# Patient Record
Sex: Male | Born: 1990 | Hispanic: No | Marital: Single | Smoking: Current every day smoker
Health system: Southern US, Community
[De-identification: ages and names within clinical notes are randomized; demographics above are authoritative.]

## PROBLEM LIST (undated history)

## (undated) DIAGNOSIS — Z789 Other specified health status: Secondary | ICD-10-CM

---
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
## 2012-01-11 ENCOUNTER — Emergency Department (HOSPITAL_COMMUNITY)
Admission: EM | Admit: 2012-01-11 | Discharge: 2012-01-11 | Disposition: A | Discharge status: Home / Self Care | Payer: Medicaid Other | Attending: Emergency Medicine | Admitting: Emergency Medicine

## 2012-01-11 ENCOUNTER — Emergency Department (HOSPITAL_COMMUNITY): Payer: Medicaid Other

## 2012-01-11 DIAGNOSIS — Z79899 Other long term (current) drug therapy: Secondary | ICD-10-CM | POA: Insufficient documentation

## 2012-01-11 DIAGNOSIS — K5900 Constipation, unspecified: Secondary | ICD-10-CM

## 2012-01-11 LAB — COMPREHENSIVE METABOLIC PANEL
AST: 24 U/L (ref 0–37)
Albumin: 4.1 g/dL (ref 3.5–5.2)
BUN: 10 mg/dL (ref 6–23)
Calcium: 9.6 mg/dL (ref 8.4–10.5)
Creatinine, Ser: 0.96 mg/dL (ref 0.50–1.35)
GFR calc non Af Amer: >90 mL/min (ref >90)

## 2012-01-11 LAB — LIPASE, BLOOD: Lipase: 16 U/L (ref 11–59)

## 2012-01-11 LAB — CBC
MCH: 24.6 pg — ABNORMAL LOW (ref 26.0–34.0)
MCV: 73.7 fL — ABNORMAL LOW (ref 78.0–100.0)
Platelets: ADEQUATE 10*3/uL (ref 150–400)
RDW: 16.6 % — ABNORMAL HIGH (ref 11.5–15.5)

## 2012-01-11 LAB — OCCULT BLOOD, POC DEVICE: Fecal Occult Bld: NEGATIVE

## 2012-01-11 MED ORDER — FLEET ENEMA 7-19 GM/118ML RE ENEM
1.0000 | ENEMA | Freq: Once | RECTAL | Status: AC
  Administered 2012-01-11: 1 via RECTAL
  Filled 2012-01-11: qty 1

## 2012-01-11 MED ORDER — SENNA-DOCUSATE SODIUM 8.6-50 MG PO TABS: 1.0000 | ORAL_TABLET | Freq: Every day | ORAL | Status: DC

## 2012-01-11 MED ORDER — POLYETHYLENE GLYCOL 3350 17 G PO PACK
17.0000 g | PACK | Freq: Every day | ORAL | Status: DC
  Administered 2012-01-11: 17 g via ORAL
  Filled 2012-01-11 (×2): qty 1

## 2012-01-11 MED ORDER — SODIUM CHLORIDE 0.9 % IV BOLUS (SEPSIS)
1000.0000 mL | Freq: Once | INTRAVENOUS | Status: AC
  Administered 2012-01-11: 1000 mL via INTRAVENOUS

## 2012-01-11 MED ORDER — MINERAL OIL RE ENEM
1.0000 | ENEMA | Freq: Once | RECTAL | Status: AC
  Administered 2012-01-11: 1 via RECTAL
  Filled 2012-01-11: qty 1

## 2012-01-11 MED ORDER — IBUPROFEN 600 MG PO TABS: 600.0000 mg | ORAL_TABLET | Freq: Four times a day (QID) | ORAL | Status: DC | PRN

## 2012-01-11 MED ORDER — POLYETHYLENE GLYCOL 3350 17 G PO PACK: 17.0000 g | PACK | Freq: Every day | ORAL | Status: DC
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
## 2012-01-11 NOTE — ED Notes (Signed)
Enema administered. Pt able to hold less than 5 min. amb to BR without problem
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
## 2012-01-11 NOTE — ED Notes (Signed)
Enema set ordered from [employer] [facility]

## 2012-01-11 NOTE — ED Notes (Signed)
Mineral oil enema given with 500ccs of warm H2O per MD order.

## 2012-01-11 NOTE — ED Notes (Signed)
Pt reports no results with enema. Drank mirilax without problem

## 2012-01-11 NOTE — ED Notes (Signed)
Patient transported to X-ray 

## 2012-01-11 NOTE — ED Notes (Signed)
Pt able to hold enema about 8 minutes this time.  On BSC at present time

## 2012-01-11 NOTE — ED Notes (Signed)
Pt had BM into BSC small amt of brown stool noted with fluid
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
## 2012-01-11 NOTE — ED Notes (Signed)
Pharmacy notified to send medicines

## 2012-01-11 NOTE — ED Notes (Signed)
Pt states he feels much better. Mother to bedside.  States he is ready to go home

## 2013-08-30 ENCOUNTER — Ambulatory Visit (INDEPENDENT_AMBULATORY_CARE_PROVIDER_SITE_OTHER): Payer: No Typology Code available for payment source | Admitting: Internal Medicine

## 2013-08-30 ENCOUNTER — Encounter: Payer: Self-pay | Admitting: Internal Medicine

## 2013-08-30 VITALS — BP 119/43 | HR 57 | Temp 96.7°F | Ht 70.0 in | Wt 143.4 lb

## 2013-08-30 DIAGNOSIS — F17200 Nicotine dependence, unspecified, uncomplicated: Secondary | ICD-10-CM

## 2013-08-30 DIAGNOSIS — W3400XA Accidental discharge from unspecified firearms or gun, initial encounter: Secondary | ICD-10-CM

## 2013-08-30 DIAGNOSIS — Z720 Tobacco use: Secondary | ICD-10-CM

## 2013-08-30 DIAGNOSIS — Z09 Encounter for follow-up examination after completed treatment for conditions other than malignant neoplasm: Secondary | ICD-10-CM

## 2013-08-30 DIAGNOSIS — F1010 Alcohol abuse, uncomplicated: Secondary | ICD-10-CM

## 2013-08-30 DIAGNOSIS — F4310 Post-traumatic stress disorder, unspecified: Secondary | ICD-10-CM

## 2013-08-30 DIAGNOSIS — S31139A Puncture wound of abdominal wall without foreign body, unspecified quadrant without penetration into peritoneal cavity, initial encounter: Secondary | ICD-10-CM

## 2013-08-30 MED ORDER — CITALOPRAM HYDROBROMIDE 20 MG PO TABS: 20.0000 mg | ORAL_TABLET | Freq: Every day | ORAL | Status: AC

## 2013-08-30 NOTE — Assessment & Plan Note (Signed)
PTSD Checklist for civilian version (PCL-C) was done in the office in my presence and patient scored 52 out of 85. According to up-to-date, a score greater than or equal to 50 suggests the possibility of PTSD (sensitivity 69-94 and specificity 83-99) and upon further interviewing the patient, I believe that his symptoms are severe enough interfering with his regular day to day life. Discussed with the attending regarding further management and plan. Discussed with the patient regarding treatment options such as psychotherapy vs medications vs both.  Plans: Patient prefered medication therapy for his symptoms suggestive of PTSD. Start SSRI therapy with Citalopram 20 mg. The possible adverse outcomes were explained to the patient and recommended to give me a call if he encounters any of the adverse outcomes. Printed material given to the patient about PTSD and Citalopram. Consider psychotherapy as an additional treatment in the subsequent office visits. The scored PCL-C PTSD check list handed over to my nurse to be scanned to the EPIC. Follow up in a month. Consider up-titration of the Citalopram as needed.
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
## 2013-08-30 NOTE — Assessment & Plan Note (Signed)
Excessive alcohol usage in the setting of PTSD. Encouraged him to cut down on the drinking gradually. Starting SSRI therapy for PTSD and will need to addressed at subsequent follow ups.
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
## 2013-08-30 NOTE — Patient Instructions (Addendum)
Take the Celexa as recommended. Posttraumatic Stress Disorder Posttraumatic stress disorder (PTSD) is a mental disorder. It occurs after a traumatic event in your life. The traumatic events that cause PTSD are outside the range of normal human experience. Examples of these events include war, automobile accidents, natural disasters, rape, domestic violence, and violent crimes. Most people who experience these types of events are able to heal on their own. Those who do not heal develop PTSD. PTSD can happen to anyone at any age. However, people with a history of childhood abuse are at increased risk for developing PTSD.  SYMPTOMS  The traumatic event that causes PTSD must be a threat to life, cause serious injury, or involve sexual violence. The traumatic event is usually experienced directly by the person who develops PTSD. Sometimes PTSD occurs in people who witness traumas that occur to others or who hear about a trauma that occurs to a close family member or friend. The following behaviors are characteristic of people with PTSD:  People with PTSD re-experience the traumatic event in one or more of the following ways (intrusion symptoms):  Recurrent, unwanted distressing memories while awake.  Recurrent distressing dreams.  Sensations similar to those felt when the event originally occurred (flashbacks).   Intense or prolonged emotional distress, triggered by reminders of the trauma. This may include fear, horror, intense sadness, or anger.  Marked physical reactions, triggered by reminders of the trauma. This may include racing heart, shortness of breath, sweating, and shaking.  People with PTSD avoid thoughts, conversations, people, or activities that remind them of the traumatic event (avoidance symptoms).  People with PTSD have negative changes in their thinking and mood after the traumatic event. These changes include:  Inability to remember one or more significant aspects of the  traumatic event (memory gaps).  Exaggerated negative perceptions about themselves or others, such as believing that they are bad people or that no one can be trusted.  Unrealistic assignment of blame to themselves or others for the traumatic event.  Persistent negative emotional state, such as fear, horror, anger, sadness, guilt, or shame.  Markedly decreased interest or participation in significant activities.  A loss of connection with other people.  Inability to experience positive emotions, such as happiness or love.  People with PTSD are more sensitive to their environment and react more easily than others (hyperarousal overreactivity symptoms). These symptoms include:  Irritability, with angry outbursts toward other people or objects. The outbursts are easily triggered and may be verbal or physical.  Careless or self-destructive behavior. This may include reckless driving or drug use.  A feeling of being on edge, with increased alertness (hypervigilance).  Exaggerated reactions to stimuli, such as being easily startled.   Difficulty concentrating.  Difficulty sleeping. PTSD symptoms may start soon after a frightening event or months or years later. They last at least 1 month or longer and can affect one or more areas of functioning, such as social or occupational functioning.  DIAGNOSIS  PTSD is diagnosed through an assessment by a mental health professional. Bonita Quin will be asked questions about the traumatic events in your life. You will also be asked about how these events have changed your thoughts, mood, behavior, and ability to function on a daily basis. You may be asked about your use of alcohol or drugs, which can make PTSD symptoms worse. TREATMENT  Unlike many mental disorders, which require life-long management, PTSD is a curable condition. The goal of PTSD treatment is to neutralize the negative effects  of the traumatic event on daily functioning, not erase the memory  of the event. The following treatments may be prescribed to reach this goal:  Medication Certain medications can reduce some PTSD symptoms. Intrusion symptoms and hyperarousal overactivity symptoms respond best to medications.  Counseling (talk therapy) Talk therapy with a mental health professional who is experienced in treating PTSD can help. Talk therapy can provide education, emotional support, and coping skills. Certain types of talk therapy that specifically target the traumatic events are the most effective treatment for PTSD:  Prolonged exposure therapy, which involves remembering and processing the traumatic event with a therapist in a safe environment until it no longer creates a negative emotional response.  Eye movement desensitization and reprocessing therapy, which involves the use of repetitive physical stimulation of the senses that alternates between the right and left sides of the body. It is believed that this therapy facilitates communication between the two sides of the brain. This communication helps the mind to integrate the fragmented memories of the traumatic event into a whole story that makes sense and no longer creates a negative emotional response. Most people with PTSD benefit from a combination of these treatments.  Document Released: 11/30/2000 Document Revised: 07/02/2012 Document Reviewed: 05/24/2012 [facility] Patient Information 2014 [place], [place].   Citalopram tablets What is this medicine? CITALOPRAM (sye TAL oh pram) is a medicine for depression. This medicine may be used for other purposes; ask your health care provider or pharmacist if you have questions. COMMON BRAND NAME(S): Celexa What should I tell my health care provider before I take this medicine? They need to know if you have any of these conditions: -bipolar disorder or a family history of bipolar disorder -diabetes -glaucoma -heart disease -history of irregular heartbeat -kidney or liver  disease -low levels of magnesium or potassium in the blood -receiving electroconvulsive therapy -seizures (convulsions) -suicidal thoughts or a previous suicide attempt -an unusual or allergic reaction to citalopram, escitalopram, other medicines, foods, dyes, or preservatives -pregnant or trying to become pregnant -breast-feeding How should I use this medicine? Take this medicine by mouth with a glass of water. Follow the directions on the prescription label. You can take it with or without food. Take your medicine at regular intervals. Do not take your medicine more often than directed. Do not stop taking this medicine suddenly except upon the advice of your doctor. Stopping this medicine too quickly may cause serious side effects or your condition may worsen. A special MedGuide will be given to you by the pharmacist with each prescription and refill. Be sure to read this information carefully each time. Talk to your pediatrician regarding the use of this medicine in children. Special care may be needed. Patients over 94 years old may have a stronger reaction and need a smaller dose. Overdosage: If you think you have taken too much of this medicine contact a poison control center or emergency room at once. NOTE: This medicine is only for you. Do not share this medicine with others. What if I miss a dose? If you miss a dose, take it as soon as you can. If it is almost time for your next dose, take only that dose. Do not take double or extra doses. What may interact with this medicine? Do not take this medicine with any of the following medications: -certain medicines for fungal infections like fluconazole, itraconazole, ketoconazole, posaconazole, voriconazole -cisapride -dofetilide -dronedarone -escitalopram -linezolid -MAOIs like Carbex, Eldepryl, Marplan, Nardil, and Parnate -methylene blue (injected into a  vein) -pimozide -thioridazine -ziprasidone  This medicine may also interact  with the following medications: -alcohol -aspirin and aspirin-like medicines -carbamazepine -certain medicines for depression, anxiety, or psychotic disturbances -certain medicines for infections like chloroquine, clarithromycin, erythromycin, furazolidone, isoniazid, pentamidine -certain medicines for migraine headaches like almotriptan, eletriptan, frovatriptan, naratriptan, rizatriptan, sumatriptan, zolmitriptan -certain medicines for sleep -certain medicines that treat or prevent blood clots like dalteparin, enoxaparin, warfarin -cimetidine -diuretics -fentanyl -lithium -methadone -metoprolol -NSAIDs, medicines for pain and inflammation, like ibuprofen or naproxen -omeprazole -other medicines that prolong the QT interval (cause an abnormal heart rhythm) -procarbazine -rasagiline -supplements like St. John's wort, kava kava, valerian -tramadol -tryptophan This list may not describe all possible interactions. Give your health care provider a list of all the medicines, herbs, non-prescription drugs, or dietary supplements you use. Also tell them if you smoke, drink alcohol, or use illegal drugs. Some items may interact with your medicine. What should I watch for while using this medicine? Tell your doctor if your symptoms do not get better or if they get worse. Visit your doctor or health care professional for regular checks on your progress. Because it may take several weeks to see the full effects of this medicine, it is important to continue your treatment as prescribed by your doctor. Patients and their families should watch out for new or worsening thoughts of suicide or depression. Also watch out for sudden changes in feelings such as feeling anxious, agitated, panicky, irritable, hostile, aggressive, impulsive, severely restless, overly excited and hyperactive, or not being able to sleep. If this happens, especially at the beginning of treatment or after a change in dose, call your  health care professional. Bonita Quin may get drowsy or dizzy. Do not drive, use machinery, or do anything that needs mental alertness until you know how this medicine affects you. Do not stand or sit up quickly, especially if you are an older patient. This reduces the risk of dizzy or fainting spells. Alcohol may interfere with the effect of this medicine. Avoid alcoholic drinks. Your mouth may get dry. Chewing sugarless gum or sucking hard candy, and drinking plenty of water will help. Contact your doctor if the problem does not go away or is severe. What side effects may I notice from receiving this medicine? Side effects that you should report to your doctor or health care professional as soon as possible: -allergic reactions like skin rash, itching or hives, swelling of the face, lips, or tongue -chest pain -confusion -dizziness -fast, irregular heartbeat -fast talking and excited feelings or actions that are out of control -feeling faint or lightheaded, falls -hallucination, loss of contact with reality -seizures -shortness of breath -suicidal thoughts or other mood changes -unusual bleeding or bruising Side effects that usually do not require medical attention (report to your doctor or health care professional if they continue or are bothersome): -blurred vision -change in appetite -change in sex drive or performance -headache -increased sweating -nausea -trouble sleeping This list may not describe all possible side effects. Call your doctor for medical advice about side effects. You may report side effects to FDA at 1-800-FDA-1088. Where should I keep my medicine? Keep out of reach of children. Store at room temperature between 15 and 30 degrees C (59 and 86 degrees F). Throw away any unused medicine after the expiration date. NOTE: This sheet is a summary. It may not cover all possible information. If you have questions about this medicine, talk to your doctor, pharmacist, or health  care provider.  2014,  [employer]vier/[employer]. (2012-09-28 13:19:48) t a trauma that occurs to a close family member or friend. The following behaviors are characteristic of people with PTSD:  People with PTSD re-experience the traumatic event in one or more of the following ways (intrusion symptoms):  Recurrent, unwanted distressing memories while awake.  Recurrent distressing dreams.  Sensations similar to those felt when the event originally occurred (flashbacks).   Intense or prolonged emotional distress, triggered by reminders of the trauma. This may include fear, horror, intense sadness, or anger.  Marked physical reactions, triggered by reminders of the trauma. This may include racing heart, shortness of breath, sweating, and shaking.  People with PTSD avoid thoughts, conversations, people, or activities that remind them of the traumatic event (avoidance symptoms).  People with PTSD have negative changes in their thinking and mood after the traumatic event. These changes include:  Inability to remember one or more significant aspects of the traumatic event (memory gaps).  Exaggerated negative perceptions about themselves or others, such as believing that they are bad people or that no one can be trusted.  Unrealistic assignment of blame to themselves or others for the traumatic event.  Persistent negative emotional state, such as fear, horror, anger, sadness, guilt, or shame.  Markedly decreased interest or participation in significant activities.  A loss of connection with other people.  Inability to experience positive emotions, such as happiness or love.  People with PTSD are more sensitive to their environment and react more easily than others (hyperarousal overreactivity symptoms). These symptoms include:  Irritability, with angry outbursts toward other people or objects. The outbursts are easily triggered and may be verbal or physical.  Careless or  self-destructive behavior. This may include reckless driving or drug use.  A feeling of being on edge, with increased alertness (hypervigilance).  Exaggerated reactions to stimuli, such as being easily startled.   Difficulty concentrating.  Difficulty sleeping. PTSD symptoms may start soon after a frightening event or months or years later. They last at least 1 month or longer and can affect one or more areas of functioning, such as social or occupational functioning.  DIAGNOSIS  PTSD is diagnosed through an assessment by a mental health professional. Bonita QuinYou will be asked questions about the traumatic events in your life. You will also be asked about how these events have changed your thoughts, mood, behavior, and ability to function on a daily basis. You may be asked about your use of alcohol or drugs, which can make PTSD symptoms worse. TREATMENT  Unlike many mental disorders, which require life-long management, PTSD is a curable condition. The goal of PTSD treatment is to neutralize the negative effects of the traumatic event on daily functioning, not erase the memory of the event. The following treatments may be prescribed to reach this goal:  Medication Certain medications can reduce some PTSD symptoms. Intrusion symptoms and hyperarousal overactivity symptoms respond best to medications.  Counseling (talk therapy) Talk therapy with a mental health professional who is experienced in treating PTSD can help. Talk therapy can provide education, emotional support, and coping skills. Certain types of talk therapy that specifically target the traumatic events are the most effective treatment for PTSD:  Prolonged exposure therapy, which involves remembering and processing the traumatic event with a therapist in a safe environment until it no longer creates a negative emotional response.  Eye movement desensitization and reprocessing therapy, which involves the use of repetitive physical  stimulation of the senses that alternates between the right and left sides of  the body. It is believed that this therapy facilitates communication between the two sides of the brain. This communication helps the mind to integrate the fragmented memories of the traumatic event into a whole story that makes sense and no longer creates a negative emotional response. Most people with PTSD benefit from a combination of these treatments.  Document Released: 11/30/2000 Document Revised: 07/02/2012 Document Reviewed: 05/24/2012 [facility] Patient Information 2014 [place], [place].

## 2013-08-30 NOTE — Assessment & Plan Note (Signed)
Counseled him regarding cessation. Patient does not seem to be interested in quitting currently. Will address at subsequent office visits.

## 2013-09-02 NOTE — Progress Notes (Signed)
Case discussed with Dr. Boggala at the time of the visit.  We reviewed the resident's history and exam and pertinent patient test results.  I agree with the assessment, diagnosis, and plan of care documented in the resident's note. 

## 2013-09-30 ENCOUNTER — Ambulatory Visit: Payer: No Typology Code available for payment source | Admitting: Internal Medicine
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
